# Patient Record
Sex: Male | Born: 1995 | Race: White | Hispanic: No | Marital: Single | Smoking: Never smoker
Health system: Southern US, Community
[De-identification: ages and names within clinical notes are randomized; demographics above are authoritative.]

## PROBLEM LIST (undated history)

## (undated) ENCOUNTER — Emergency Department (HOSPITAL_COMMUNITY): Admission: EM | Payer: Self-pay | Source: Home / Self Care

## (undated) HISTORY — PX: HAND SURGERY: SHX662

---
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
## 2014-10-07 ENCOUNTER — Emergency Department (HOSPITAL_COMMUNITY): Payer: Managed Care, Other (non HMO)

## 2014-10-07 ENCOUNTER — Emergency Department (HOSPITAL_COMMUNITY)
Admission: EM | Admit: 2014-10-07 | Discharge: 2014-10-07 | Disposition: A | Discharge status: Home / Self Care | Payer: Managed Care, Other (non HMO) | Attending: Emergency Medicine | Admitting: Emergency Medicine

## 2014-10-07 ENCOUNTER — Encounter (HOSPITAL_COMMUNITY): Payer: Self-pay

## 2014-10-07 DIAGNOSIS — Y9389 Activity, other specified: Secondary | ICD-10-CM | POA: Insufficient documentation

## 2014-10-07 DIAGNOSIS — X58XXXA Exposure to other specified factors, initial encounter: Secondary | ICD-10-CM | POA: Diagnosis not present

## 2014-10-07 DIAGNOSIS — Z79899 Other long term (current) drug therapy: Secondary | ICD-10-CM | POA: Diagnosis not present

## 2014-10-07 DIAGNOSIS — Y998 Other external cause status: Secondary | ICD-10-CM | POA: Diagnosis not present

## 2014-10-07 DIAGNOSIS — Y9289 Other specified places as the place of occurrence of the external cause: Secondary | ICD-10-CM | POA: Diagnosis not present

## 2014-10-07 DIAGNOSIS — S99911A Unspecified injury of right ankle, initial encounter: Secondary | ICD-10-CM | POA: Diagnosis present

## 2014-10-07 DIAGNOSIS — S82201A Unspecified fracture of shaft of right tibia, initial encounter for closed fracture: Secondary | ICD-10-CM

## 2014-10-07 DIAGNOSIS — S8254XA Nondisplaced fracture of medial malleolus of right tibia, initial encounter for closed fracture: Secondary | ICD-10-CM | POA: Insufficient documentation

## 2014-10-07 MED ORDER — OXYCODONE-ACETAMINOPHEN 5-325 MG PO TABS
ORAL_TABLET | ORAL | Status: AC
  Filled 2014-10-07: qty 1

## 2014-10-07 MED ORDER — OXYCODONE-ACETAMINOPHEN 5-325 MG PO TABS
1.0000 | ORAL_TABLET | Freq: Once | ORAL | Status: AC
  Administered 2014-10-07: 1 via ORAL

## 2014-10-07 NOTE — Discharge Instructions (Signed)
Cast or Splint Care °Casts and splints support injured limbs and keep bones from moving while they heal.  °HOME CARE °· Keep the cast or splint uncovered during the drying period. °· A plaster cast can take 24 to 48 hours to dry. °· A fiberglass cast will dry in less than 1 hour. °· Do not rest the cast on anything harder than a pillow for 24 hours. °· Do not put weight on your injured limb. Do not put pressure on the cast. Wait for your doctor's approval. °· Keep the cast or splint dry. °· Cover the cast or splint with a plastic bag during baths or wet weather. °· If you have a cast over your chest and belly (trunk), take sponge baths until the cast is taken off. °· If your cast gets wet, dry it with a towel or blow dryer. Use the cool setting on the blow dryer. °· Keep your cast or splint clean. Wash a dirty cast with a damp cloth. °· Do not put any objects under your cast or splint. °· Do not scratch the skin under the cast with an object. If itching is a problem, use a blow dryer on a cool setting over the itchy area. °· Do not trim or cut your cast. °· Do not take out the padding from inside your cast. °· Exercise your joints near the cast as told by your doctor. °· Raise (elevate) your injured limb on 1 or 2 pillows for the first 1 to 3 days. °GET HELP IF: °· Your cast or splint cracks. °· Your cast or splint is too tight or too loose. °· You itch badly under the cast. °· Your cast gets wet or has a soft spot. °· You have a bad smell coming from the cast. °· You get an object stuck under the cast. °· Your skin around the cast becomes red or sore. °· You have new or more pain after the cast is put on. °GET HELP RIGHT AWAY IF: °· You have fluid leaking through the cast. °· You cannot move your fingers or toes. °· Your fingers or toes turn blue or white or are cool, painful, or puffy (swollen). °· You have tingling or lose feeling (numbness) around the injured area. °· You have bad pain or pressure under the  cast. °· You have trouble breathing or have shortness of breath. °· You have chest pain. °Document Released: 07/15/2010 Document Revised: 11/15/2012 Document Reviewed: 09/21/2012 °Exit[employer]® Patient Information ©2015 [employer]. This information is not intended to replace advice given to you by your health care provider. Make sure you discuss any questions you have with your health care provider. °Ankle Fracture °A fracture is a break in a bone. The ankle joint is made up of three bones. These include the lower (distal) sections of your lower leg bones, called the tibia and fibula, along with a bone in your foot, called the talus. Depending on how bad the break is and if more than one ankle joint bone is broken, a cast or splint is used to protect and keep your injured bone from moving while it heals. Sometimes, surgery is required to help the fracture heal properly.  °There are two general types of fractures: °· Stable fracture. This includes a single fracture line through one bone, with no injury to ankle ligaments. A fracture of the talus that does not have any displacement (movement of the bone on either side of the fracture line) is also stable. °· Unstable   fracture. This includes more than one fracture line through one or more bones in the ankle joint. It also includes fractures that have displacement of the bone on either side of the fracture line. °CAUSES °· A direct blow to the ankle.   °· Quickly and severely twisting your ankle. °· Trauma, such as a car accident or falling from a significant height. °RISK FACTORS °You may be at a higher risk of ankle fracture if: °· You have certain medical conditions. °· You are involved in high-impact sports. °· You are involved in a high-impact car accident. °SIGNS AND SYMPTOMS  °· Tender and swollen ankle. °· Bruising around the injured ankle. °· Pain on movement of the ankle. °· Difficulty walking or putting weight on the ankle. °· A cold foot below the site of the  ankle injury. This can occur if the blood vessels passing through your injured ankle were also damaged. °· Numbness in the foot below the site of the ankle injury. °DIAGNOSIS  °An ankle fracture is usually diagnosed with a physical exam and X-rays. A CT scan may also be required for complex fractures. °TREATMENT  °Stable fractures are treated with a cast or splint and using crutches to avoid putting weight on your injured ankle. This is followed by an ankle strengthening program. Some patients require a special type of cast, depending on other medical problems they may have. Unstable fractures require surgery to ensure the bones heal properly. Your health care provider will tell you what type of fracture you have and the best treatment for your condition. °HOME CARE INSTRUCTIONS  °· Review correct crutch use with your health care provider and use your crutches as directed. Safe use of crutches is extremely important. Misuse of crutches can cause you to fall or cause injury to nerves in your hands or armpits. °· Do not put weight or pressure on the injured ankle until directed by your health care provider. °· To lessen the swelling, keep the injured leg elevated while sitting or lying down. °· Apply ice to the injured area: °¨ Put ice in a plastic bag. °¨ Place a towel between your cast and the bag. °¨ Leave the ice on for 20 minutes, 2-3 times a day. °· If you have a plaster or fiberglass cast: °¨ Do not try to scratch the skin under the cast with any objects. This can increase your risk of skin infection. °¨ Check the skin around the cast every day. You may put lotion on any red or sore areas. °¨ Keep your cast dry and clean. °· If you have a plaster splint: °¨ Wear the splint as directed. °¨ You may loosen the elastic around the splint if your toes become numb, tingle, or turn cold or blue. °· Do not put pressure on any part of your cast or splint; it may break. Rest your cast only on a pillow the first 24 hours  until it is fully hardened. °· Your cast or splint can be protected during bathing with a plastic bag sealed to your skin with medical tape. Do not lower the cast or splint into water. °· Take medicines as directed by your health care provider. Only take over-the-counter or prescription medicines for pain, discomfort, or fever as directed by your health care provider. °· Do not drive a vehicle until your health care provider specifically tells you it is safe to do so. °· If your health care provider has given you a follow-up appointment, it is very important to   keep that appointment. Not keeping the appointment could result in a chronic or permanent injury, pain, and disability. If you have any problem keeping the appointment, call the facility for assistance. °SEEK MEDICAL CARE IF: °You develop increased swelling or discomfort. °SEEK IMMEDIATE MEDICAL CARE IF:  °· Your cast gets damaged or breaks. °· You have continued severe pain. °· You develop new pain or swelling after the cast was put on. °· Your skin or toenails below the injury turn blue or gray. °· Your skin or toenails below the injury feel cold, numb, or have loss of sensitivity to touch. °· There is a bad smell or pus draining from under the cast. °MAKE SURE YOU:  °· Understand these instructions. °· Will watch your condition. °· Will get help right away if you are not doing well or get worse. °Document Released: 03/12/2000 Document Revised: 03/20/2013 Document Reviewed: 10/12/2012 °ExitCare® Patient Information ©2015 [employer]. This information is not intended to replace advice given to you by your health care provider. Make sure you discuss any questions you have with your health care provider. ° °
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
## 2014-10-07 NOTE — ED Notes (Signed)
Paged Ortho  

## 2014-10-07 NOTE — ED Notes (Signed)
Ortho at bedside.

## 2014-10-07 NOTE — Progress Notes (Signed)
Orthopedic Tech Progress Note Patient Details:  Ricky JulianJesse R Kemmerer November 19, 1995 [id number] Applied fiberglass short leg splint to RLE.  Pulses, sensation, motion intact before and after splinting.  Capillary refill less than 2 seconds before and after splinting.  Fit pt. for crutches and taught use of same. Ortho Devices Type of Ortho Device: Post (short leg) splint, Crutches Ortho Device/Splint Location: RLE Ortho Device/Splint Interventions: Application   Lesle ChrisGilliland, Zakkiyya Barno L 10/07/2014, 10:08 PM
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
## 2018-03-02 DIAGNOSIS — Z23 Encounter for immunization: Secondary | ICD-10-CM | POA: Diagnosis not present

## 2018-03-07 DIAGNOSIS — J0100 Acute maxillary sinusitis, unspecified: Secondary | ICD-10-CM | POA: Diagnosis not present

## 2018-05-22 DIAGNOSIS — Z6830 Body mass index (BMI) 30.0-30.9, adult: Secondary | ICD-10-CM | POA: Diagnosis not present

## 2018-05-22 DIAGNOSIS — Z0000 Encounter for general adult medical examination without abnormal findings: Secondary | ICD-10-CM | POA: Diagnosis not present

## 2018-05-27 DIAGNOSIS — Z0000 Encounter for general adult medical examination without abnormal findings: Secondary | ICD-10-CM | POA: Diagnosis not present

## 2018-05-28 DIAGNOSIS — Z0000 Encounter for general adult medical examination without abnormal findings: Secondary | ICD-10-CM | POA: Diagnosis not present

## 2021-06-08 ENCOUNTER — Ambulatory Visit: Payer: Self-pay | Admitting: Podiatry
